# Patient Record
Sex: Female | Born: 1975 | Race: White | Hispanic: No | Marital: Married | Smoking: Never smoker
Health system: Southern US, Community
[De-identification: ages and names within clinical notes are randomized; demographics above are authoritative.]

## PROBLEM LIST (undated history)

## (undated) DIAGNOSIS — J45909 Unspecified asthma, uncomplicated: Secondary | ICD-10-CM

## (undated) DIAGNOSIS — G43909 Migraine, unspecified, not intractable, without status migrainosus: Secondary | ICD-10-CM

## (undated) DIAGNOSIS — I471 Supraventricular tachycardia: Secondary | ICD-10-CM

## (undated) HISTORY — PX: CARDIAC CATHETERIZATION: SHX172

## (undated) HISTORY — PX: KNEE SURGERY: SHX244

## (undated) HISTORY — PX: TUBAL LIGATION: SHX77

---

## 2006-05-31 ENCOUNTER — Emergency Department (HOSPITAL_COMMUNITY): Admission: EM | Admit: 2006-05-31 | Discharge: 2006-05-31 | Payer: Self-pay | Admitting: Emergency Medicine

## 2007-01-03 ENCOUNTER — Emergency Department (HOSPITAL_COMMUNITY): Admission: EM | Admit: 2007-01-03 | Discharge: 2007-01-03 | Payer: Self-pay | Admitting: Emergency Medicine

## 2007-01-04 ENCOUNTER — Ambulatory Visit (HOSPITAL_COMMUNITY): Admission: RE | Admit: 2007-01-04 | Discharge: 2007-01-04 | Payer: Self-pay | Admitting: Emergency Medicine

## 2007-04-14 ENCOUNTER — Emergency Department (HOSPITAL_COMMUNITY): Admission: EM | Admit: 2007-04-14 | Discharge: 2007-04-14 | Payer: Self-pay | Admitting: Emergency Medicine

## 2007-09-12 ENCOUNTER — Emergency Department: Payer: Self-pay

## 2007-09-19 ENCOUNTER — Emergency Department (HOSPITAL_COMMUNITY): Admission: EM | Admit: 2007-09-19 | Discharge: 2007-09-19 | Payer: Self-pay | Admitting: Emergency Medicine

## 2007-12-06 ENCOUNTER — Emergency Department (HOSPITAL_COMMUNITY): Admission: EM | Admit: 2007-12-06 | Discharge: 2007-12-06 | Payer: Self-pay | Admitting: Emergency Medicine

## 2007-12-15 ENCOUNTER — Emergency Department (HOSPITAL_COMMUNITY): Admission: EM | Admit: 2007-12-15 | Discharge: 2007-12-15 | Payer: Self-pay | Admitting: Emergency Medicine

## 2009-09-16 IMAGING — CR DG CHEST 1V PORT
1 series · 1 of 1 positions shown · non-contrast
Comparison: PA and lateral chest 05/31/06.

CLINICAL DATA: Chest pain.  Shortness of breath.

PORTABLE CHEST - 1 VIEW

[view not recorded]
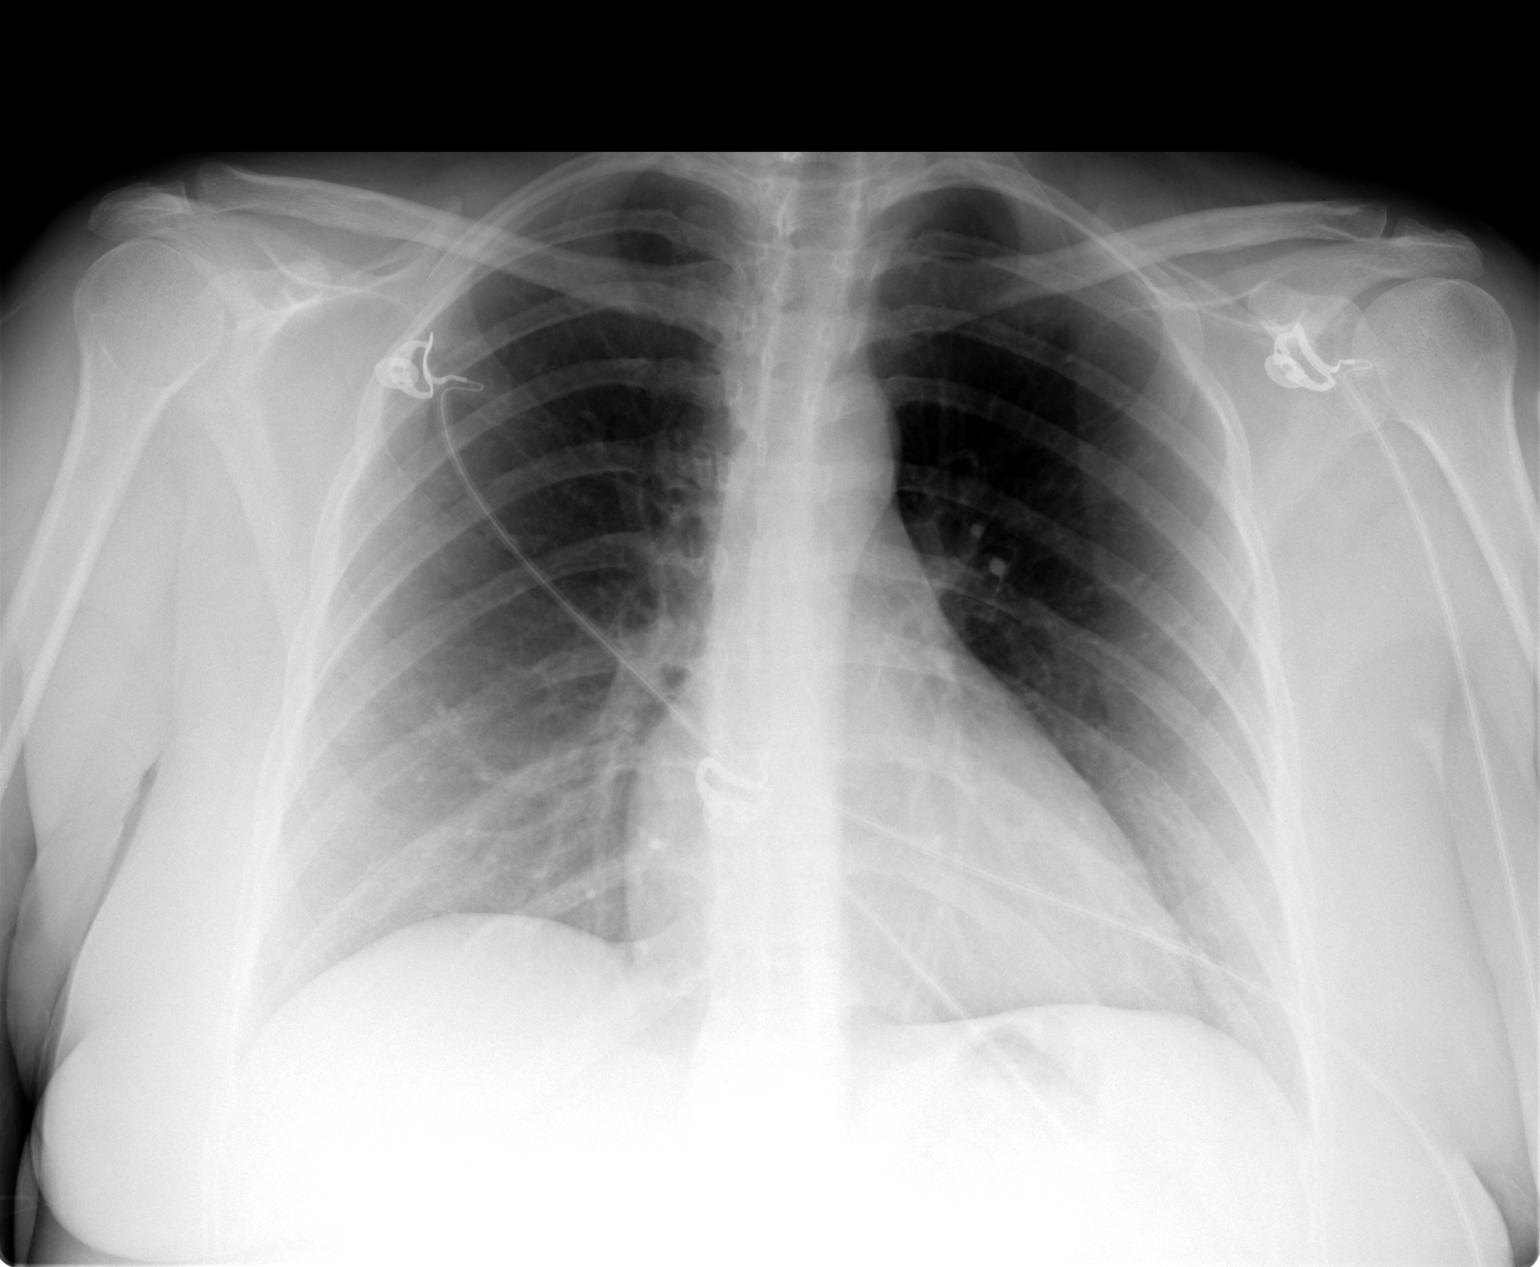

[1 of 1 positions shown; findings below may reference images not displayed]

FINDINGS: Lungs clear.  Heart size normal.  No effusion or focal
bony abnormality.
IMPRESSION: Negative chest.

## 2014-05-01 ENCOUNTER — Encounter (HOSPITAL_COMMUNITY): Payer: Self-pay | Admitting: Emergency Medicine

## 2014-05-01 ENCOUNTER — Emergency Department (HOSPITAL_COMMUNITY)
Admission: EM | Admit: 2014-05-01 | Discharge: 2014-05-01 | Disposition: A | Discharge status: Home / Self Care | Payer: Medicaid Other | Attending: Emergency Medicine | Admitting: Emergency Medicine

## 2014-05-01 DIAGNOSIS — F411 Generalized anxiety disorder: Secondary | ICD-10-CM | POA: Insufficient documentation

## 2014-05-01 DIAGNOSIS — S60529A Blister (nonthermal) of unspecified hand, initial encounter: Secondary | ICD-10-CM | POA: Insufficient documentation

## 2014-05-01 DIAGNOSIS — Y929 Unspecified place or not applicable: Secondary | ICD-10-CM | POA: Diagnosis not present

## 2014-05-01 DIAGNOSIS — Z9889 Other specified postprocedural states: Secondary | ICD-10-CM | POA: Insufficient documentation

## 2014-05-01 DIAGNOSIS — J45909 Unspecified asthma, uncomplicated: Secondary | ICD-10-CM | POA: Insufficient documentation

## 2014-05-01 DIAGNOSIS — S6990XA Unspecified injury of unspecified wrist, hand and finger(s), initial encounter: Secondary | ICD-10-CM | POA: Insufficient documentation

## 2014-05-01 DIAGNOSIS — R209 Unspecified disturbances of skin sensation: Secondary | ICD-10-CM | POA: Diagnosis not present

## 2014-05-01 DIAGNOSIS — X58XXXA Exposure to other specified factors, initial encounter: Secondary | ICD-10-CM | POA: Insufficient documentation

## 2014-05-01 DIAGNOSIS — M79609 Pain in unspecified limb: Secondary | ICD-10-CM | POA: Insufficient documentation

## 2014-05-01 DIAGNOSIS — Y9389 Activity, other specified: Secondary | ICD-10-CM | POA: Diagnosis not present

## 2014-05-01 DIAGNOSIS — Z8679 Personal history of other diseases of the circulatory system: Secondary | ICD-10-CM | POA: Diagnosis not present

## 2014-05-01 DIAGNOSIS — T148XXA Other injury of unspecified body region, initial encounter: Secondary | ICD-10-CM

## 2014-05-01 DIAGNOSIS — Z880 Allergy status to penicillin: Secondary | ICD-10-CM | POA: Insufficient documentation

## 2014-05-01 HISTORY — DX: Migraine, unspecified, not intractable, without status migrainosus: G43.909

## 2014-05-01 HISTORY — DX: Unspecified asthma, uncomplicated: J45.909

## 2014-05-01 HISTORY — DX: Supraventricular tachycardia: I47.1

## 2014-05-01 MED ORDER — DOUBLE ANTIBIOTIC 500-10000 UNIT/GM EX OINT: TOPICAL_OINTMENT | Freq: Once | CUTANEOUS | Status: DC

## 2014-05-01 MED ORDER — IBUPROFEN 800 MG PO TABS
800.0000 mg | ORAL_TABLET | Freq: Once | ORAL | Status: AC
  Administered 2014-05-01: 800 mg via ORAL
  Filled 2014-05-01: qty 1

## 2014-05-01 MED ORDER — MUPIROCIN 2 % EX OINT
TOPICAL_OINTMENT | Freq: Two times a day (BID) | CUTANEOUS | Status: DC
  Filled 2014-05-01: qty 22

## 2014-05-01 MED ORDER — BACITRACIN ZINC 500 UNIT/GM EX OINT
TOPICAL_OINTMENT | CUTANEOUS | Status: AC
  Administered 2014-05-01: 1
  Filled 2014-05-01: qty 0.9

## 2014-05-01 NOTE — ED Provider Notes (Signed)
CSN: [id number]     Arrival date & time 05/01/14  1410 History   First MD Initiated Contact with Patient 05/01/14 1441     Chief Complaint  Patient presents with  . Hand Pain     (Consider location/radiation/quality/duration/timing/severity/associated sxs/prior Treatment) HPI Comments: Patient is a 38 year old female with history of asthma, migraine, SVT who presents to the emergency department today with bilateral hand pain. Just prior to arrival she was holding a rope while her husband was cutting trees and it slipped through her hands very quickly. She has blisters to her index, middle, ring, little fingers on both of her hands. She is currently holding her hands and a cold washcloth. This is improving her symptoms. She did not take any medications prior to arrival. Her TDAP is up to date. No other injuries.  The history is provided by the patient. No language interpreter was used.    Past Medical History  Diagnosis Date  . Asthma   . Migraine   . SVT (supraventricular tachycardia)    Past Surgical History  Procedure Laterality Date  . Cardiac catheterization    . Knee surgery    . Tubal ligation     No family history on file. History  Substance Use Topics  . Smoking status: Never Smoker   . Smokeless tobacco: Not on file  . Alcohol Use: No   OB History   Grav Para Term Preterm Abortions TAB SAB Ect Mult Living                 Review of Systems  Constitutional: Negative for fever and chills.  Respiratory: Negative for shortness of breath.   Cardiovascular: Negative for chest pain.  Gastrointestinal: Negative for nausea, vomiting and abdominal pain.  Musculoskeletal: Positive for myalgias.  Skin: Positive for wound.  All other systems reviewed and are negative.     Allergies  Bee venom; Coconut flavor; Erythromycin; Keflex; and Penicillins  Home Medications   Prior to Admission medications   Not on File   BP 103/74  Pulse 121  Temp(Src) 97.9 F (36.6 C)  (Oral)  Resp 24  Ht 5\' 3"  (1.6 m)  Wt 247 lb (112.038 kg)  BMI 43.76 kg/m2  SpO2 100%  LMP 04/15/2014 Physical Exam  Nursing note and vitals reviewed. Constitutional: She is oriented to person, place, and time. She appears well-developed and well-nourished. No distress.  HENT:  Head: Normocephalic and atraumatic.  Right Ear: External ear normal.  Left Ear: External ear normal.  Nose: Nose normal.  Mouth/Throat: Oropharynx is clear and moist.  Eyes: Conjunctivae are normal.  Neck: Normal range of motion.  Cardiovascular: Regular rhythm and normal heart sounds.  Tachycardia present.   Heart rate stays around 105 on my exam  Pulmonary/Chest: Effort normal and breath sounds normal. No stridor. No respiratory distress. She has no wheezes. She has no rales.  Abdominal: Soft. She exhibits no distension.  Musculoskeletal: Normal range of motion.  Neurological: She is alert and oriented to person, place, and time. She has normal strength.  Skin: Skin is warm and dry. She is not diaphoretic. No erythema.  Blisters to the distal end of the second through fifth fingers bilaterally.   Psychiatric: Her behavior is normal. Her mood appears anxious.    ED Course  Procedures (including critical care time) Labs Review Labs Reviewed - No data to display  Imaging Review No results found.   EKG Interpretation None      MDM   Final diagnoses:  Blister    Patient presents to ED after sustaining rope burn to her hands. She has blisters on her palmar surface. Tdap is UTD. Feeling improved after she was given cold washcloths to soak her hands in. Discussed keeping this area clean. Patient was initially tachycardic on arrival to ED. She was tachycardic around 105 on my exam.  No chest pain or shortness of breath. Discussed reasons to return to ED immediately. Vital signs stable for discharge. Patient / Family / Caregiver informed of clinical course, understand medical decision-making process,  and agree with plan.     Mora Bellman, PA-C 05/04/14 [phone]

## 2014-05-01 NOTE — ED Notes (Signed)
Patient with no complaints at this time. Respirations even and unlabored. Skin warm/dry. Discharge instructions reviewed with patient at this time. Patient given opportunity to voice concerns/ask questions. Patient discharged at this time and left Emergency Department with steady gait.   

## 2014-05-01 NOTE — ED Notes (Signed)
Holding rope with rope slide through fingers too fast, c/o burning to index, middle, ring, and little fingers on both hands.

## 2014-05-04 NOTE — ED Provider Notes (Signed)
Medical screening examination/treatment/procedure(s) were performed by non-physician practitioner and as supervising physician I was immediately available for consultation/collaboration.   EKG Interpretation None        Leila Schuff L Blaze Sandin, MD 05/04/14 1704
